# Patient Record
Sex: Female | Born: 2000 | Race: White | Hispanic: Yes | Marital: Single | Smoking: Never smoker
Health system: Southern US, Community
[De-identification: ages and names within clinical notes are randomized; demographics above are authoritative.]

## PROBLEM LIST (undated history)

## (undated) ENCOUNTER — Ambulatory Visit (HOSPITAL_COMMUNITY)

## (undated) DIAGNOSIS — J45909 Unspecified asthma, uncomplicated: Secondary | ICD-10-CM

## (undated) HISTORY — PX: TYMPANOSTOMY TUBE PLACEMENT: SHX32

---
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
## 2020-06-11 ENCOUNTER — Encounter (HOSPITAL_COMMUNITY): Payer: Self-pay

## 2020-06-11 ENCOUNTER — Emergency Department (HOSPITAL_COMMUNITY)
Admission: EM | Admit: 2020-06-11 | Discharge: 2020-06-12 | Disposition: A | Discharge status: Home / Self Care | Payer: Medicaid Other | Attending: Emergency Medicine | Admitting: Emergency Medicine

## 2020-06-11 ENCOUNTER — Other Ambulatory Visit: Payer: Self-pay

## 2020-06-11 DIAGNOSIS — R1030 Lower abdominal pain, unspecified: Secondary | ICD-10-CM | POA: Insufficient documentation

## 2020-06-11 DIAGNOSIS — Z5321 Procedure and treatment not carried out due to patient leaving prior to being seen by health care provider: Secondary | ICD-10-CM | POA: Insufficient documentation

## 2020-06-11 LAB — URINALYSIS, ROUTINE W REFLEX MICROSCOPIC
Bilirubin Urine: NEGATIVE
Glucose, UA: NEGATIVE mg/dL
Ketones, ur: NEGATIVE mg/dL
Nitrite: NEGATIVE
Protein, ur: 100 mg/dL — AB
RBC / HPF: >50 RBC/hpf — ABNORMAL HIGH (ref 0–5)
Specific Gravity, Urine: 1.019 (ref 1.005–1.030)
WBC, UA: >50 WBC/hpf — ABNORMAL HIGH (ref 0–5)
pH: 6 (ref 5.0–8.0)

## 2020-06-11 LAB — PREGNANCY, URINE: Preg Test, Ur: NEGATIVE

## 2020-06-11 NOTE — ED Triage Notes (Signed)
Pt reports lower abd pain since earlier today, no n/v, no vaginal bleeding or discharge

## 2020-06-12 NOTE — ED Notes (Signed)
Patient called for vitals recheck with no response 

## 2021-01-12 ENCOUNTER — Ambulatory Visit (HOSPITAL_COMMUNITY)
Admission: EM | Admit: 2021-01-12 | Discharge: 2021-01-12 | Disposition: A | Discharge status: Home / Self Care | Payer: Medicaid Other | Attending: Physician Assistant | Admitting: Physician Assistant

## 2021-01-12 ENCOUNTER — Encounter (HOSPITAL_COMMUNITY): Payer: Self-pay | Admitting: Emergency Medicine

## 2021-01-12 ENCOUNTER — Other Ambulatory Visit: Payer: Self-pay

## 2021-01-12 DIAGNOSIS — R52 Pain, unspecified: Secondary | ICD-10-CM

## 2021-01-12 DIAGNOSIS — R519 Headache, unspecified: Secondary | ICD-10-CM | POA: Diagnosis not present

## 2021-01-12 LAB — CBC WITH DIFFERENTIAL/PLATELET
Abs Immature Granulocytes: 0.02 10*3/uL (ref 0.00–0.07)
Basophils Absolute: 0 10*3/uL (ref 0.0–0.1)
Basophils Relative: 1 %
Eosinophils Absolute: 0 10*3/uL (ref 0.0–0.5)
Eosinophils Relative: 1 %
HCT: 41.1 % (ref 36.0–46.0)
Hemoglobin: 13 g/dL (ref 12.0–15.0)
Immature Granulocytes: 1 %
Lymphocytes Relative: 43 %
Lymphs Abs: 1.6 10*3/uL (ref 0.7–4.0)
MCH: 25.5 pg — ABNORMAL LOW (ref 26.0–34.0)
MCHC: 31.6 g/dL (ref 30.0–36.0)
MCV: 80.7 fL (ref 80.0–100.0)
Monocytes Absolute: 0.4 10*3/uL (ref 0.1–1.0)
Monocytes Relative: 12 %
Neutro Abs: 1.5 10*3/uL — ABNORMAL LOW (ref 1.7–7.7)
Neutrophils Relative %: 42 %
Platelets: 265 10*3/uL (ref 150–400)
RBC: 5.09 MIL/uL (ref 3.87–5.11)
RDW: 15.8 % — ABNORMAL HIGH (ref 11.5–15.5)
WBC: 3.7 10*3/uL — ABNORMAL LOW (ref 4.0–10.5)
nRBC: 0.5 % — ABNORMAL HIGH (ref 0.0–0.2)

## 2021-01-12 LAB — COMPREHENSIVE METABOLIC PANEL
ALT: 27 U/L (ref 0–44)
AST: 37 U/L (ref 15–41)
Albumin: 4 g/dL (ref 3.5–5.0)
Alkaline Phosphatase: 80 U/L (ref 38–126)
Anion gap: 9 (ref 5–15)
BUN: 6 mg/dL (ref 6–20)
CO2: 23 mmol/L (ref 22–32)
Calcium: 8.9 mg/dL (ref 8.9–10.3)
Chloride: 103 mmol/L (ref 98–111)
Creatinine, Ser: 0.54 mg/dL (ref 0.44–1.00)
GFR, Estimated: >60 mL/min (ref >60)
Glucose, Bld: 77 mg/dL (ref 70–99)
Potassium: 4.1 mmol/L (ref 3.5–5.1)
Sodium: 135 mmol/L (ref 135–145)
Total Bilirubin: 0.1 mg/dL — ABNORMAL LOW (ref 0.3–1.2)
Total Protein: 8.2 g/dL — ABNORMAL HIGH (ref 6.5–8.1)

## 2021-01-12 LAB — SEDIMENTATION RATE: Sed Rate: 17 mm/hr (ref 0–22)

## 2021-01-12 MED ORDER — BACLOFEN 10 MG PO TABS: 10.0000 mg | ORAL_TABLET | Freq: Every evening | ORAL | 0 refills | Status: AC | PRN

## 2021-01-12 MED ORDER — PREDNISONE 10 MG (21) PO TBPK: ORAL_TABLET | ORAL | 0 refills | Status: AC

## 2021-01-12 NOTE — ED Triage Notes (Signed)
headache and nausea started yesterday.  Last night , headache was very painful.  Took tylenol and no relief.  Pain has been constant.  Patient has prescription glasses, but while having this headache, glasses add to discomfort.    Wednesday, felt nausea, no energy.  Thursday had a fever, chills, unable to eat.

## 2021-01-12 NOTE — ED Notes (Signed)
Both eyes distance 20/50

## 2021-01-12 NOTE — Discharge Instructions (Signed)
We will start prednisone to try to alleviate your headache as well manage her cough and other symptoms.  I have called in a muscle relaxer that you can take at night but you should not drive or drink alcohol with this medication as drowsiness is a common side effect.  While you are taking prednisone do not take any additional NSAIDs (aspirin, ibuprofen/Advil, naproxen/Aleve).  You can use Tylenol for breakthrough pain.  If your lab work shows that you have significant inflammation you will need to be scheduled with the vascular surgeon and we will contact you.  If you have any worsening symptoms you need to go to the emergency room as we discussed.

## 2021-01-12 NOTE — ED Provider Notes (Signed)
[name]    CSN: [id number] Arrival date & time: 01/12/21  1353      History   Chief Complaint Chief Complaint  Patient presents with  . Headache    HPI Kimberly Myers is a 20 y.o. female.   Patient presents today with a 3-day history of severe headache.  She denies history of migraines or family history of primary headache disorder.  She does report initially symptoms began as URI with mild fever, cough, congestion the symptoms have since resolved and headache has persisted.  Headache pain has improved over the past 24 hours but not yet resolved.  It is currently rated 4 on a 0-10 pain scale, localized to left temple without radiation, described as aching/throbbing, worse with mastication, no alleviating factors identified.  She denies any visual changes.  She denies associated dysarthria, focal weakness, nausea, vomiting, photophobia, phonophobia.  She has not tried any over-the-counter medications for symptom management.  She denies any medication changes prior to symptom onset.  She denies any recent head injury.  She denies any personal or family history of autoimmune condition including vasculitis/temporal arteritis.     Past Medical History:  Diagnosis Date  . Asthma    inhalers as needed    There are no problems to display for this patient.   Past Surgical History:  Procedure Laterality Date  . TYMPANOPLASTY Right 06/13/2019   Procedure: TYMPANOPLASTY;  Surgeon: Izora Gala, MD;  Location: [facility];  Service: ENT;  Laterality: Right;  . TYMPANOSTOMY TUBE PLACEMENT      OB History   No obstetric history on file.      Home Medications    Prior to Admission medications   Medication Sig Start Date End Date Taking? Authorizing Provider  baclofen (LIORESAL) 10 MG tablet Take 1 tablet (10 mg total) by mouth at bedtime as needed for muscle spasms. 01/12/21  Yes Ife Vitelli, Derry Skill, PA-C  HYDROcodone-acetaminophen (NORCO) 7.5-325 MG  tablet Take 1 tablet by mouth every 6 (six) hours as needed for moderate pain. 06/13/19  Yes Izora Gala, MD  predniSONE (STERAPRED UNI-PAK 21 TAB) 10 MG (21) TBPK tablet As directed 01/12/21  Yes Elisia Stepp K, PA-C  promethazine (PHENERGAN) 25 MG suppository Place 1 suppository (25 mg total) rectally every 6 (six) hours as needed for nausea or vomiting. Patient not taking: Reported on 01/12/2021 06/13/19   Izora Gala, MD    Family History Family History  Problem Relation Age of Onset  . Healthy Mother     Social History Social History   Tobacco Use  . Smoking status: Never Smoker  . Smokeless tobacco: Never Used  Vaping Use  . Vaping Use: Never used  Substance Use Topics  . Alcohol use: No  . Drug use: Never     Allergies   Patient has no known allergies.   Review of Systems Review of Systems  Constitutional: Negative for activity change, appetite change, fatigue and fever.  HENT: Negative for trouble swallowing.   Eyes: Negative for visual disturbance.  Respiratory: Negative for cough and shortness of breath.   Cardiovascular: Negative for chest pain.  Gastrointestinal: Negative for abdominal pain, diarrhea, nausea and vomiting.  Neurological: Positive for headaches. Negative for dizziness, seizures, syncope, weakness, light-headedness and numbness.     Physical Exam Triage Vital Signs ED Triage Vitals  Enc Vitals Group     BP 01/12/21 1559 123/66     Pulse Rate 01/12/21 1559 80     Resp  01/12/21 1559 18     Temp 01/12/21 1559 99.8 F (37.7 C)     Temp Source 01/12/21 1559 Oral     SpO2 01/12/21 1559 98 %     Weight --      Height --      Head Circumference --      Peak Flow --      Pain Score 01/12/21 1555 4     Pain Loc --      Pain Edu? --      Excl. in [place]? --    No data found.  Updated Vital Signs BP 123/66 (BP Location: Right Arm)   Pulse 80   Temp 99.8 F (37.7 C) (Oral)   Resp 18   LMP 11/23/2020   SpO2 98%   Visual Acuity Right  Eye Distance: 20/70 Left Eye Distance: 20/50 Bilateral Distance:    Right Eye Near:   Left Eye Near:    Bilateral Near:     Physical Exam Vitals reviewed.  Constitutional:      General: She is awake. She is not in acute distress.    Appearance: Normal appearance. She is not ill-appearing.     Comments: Very pleasant female appears stated age in no acute distress  HENT:     Head: Normocephalic and atraumatic. No raccoon eyes, Battle's sign or contusion.     Jaw: No pain on movement.     Right Ear: Ear canal and external ear normal. No hemotympanum. Tympanic membrane is scarred.     Left Ear: Tympanic membrane, ear canal and external ear normal. No hemotympanum. Tympanic membrane is not scarred.     Nose: Nose normal.     Mouth/Throat:     Tongue: Tongue does not deviate from midline.     Pharynx: Uvula midline. No oropharyngeal exudate or posterior oropharyngeal erythema.  Eyes:     Extraocular Movements: Extraocular movements intact.     Pupils: Pupils are equal, round, and reactive to light.  Cardiovascular:     Rate and Rhythm: Normal rate and regular rhythm.     Heart sounds: No murmur heard.   Pulmonary:     Effort: Pulmonary effort is normal.     Breath sounds: Normal breath sounds. No wheezing, rhonchi or rales.     Comments: Clear to auscultation bilaterally Abdominal:     Palpations: Abdomen is soft.     Tenderness: There is no abdominal tenderness.  Musculoskeletal:     Cervical back: No spinous process tenderness or muscular tenderness.     Comments: Strength 5/5 bilateral upper and lower extremities  Lymphadenopathy:     Head:     Right side of head: No submental, submandibular or tonsillar adenopathy.     Left side of head: No submental, submandibular or tonsillar adenopathy.  Neurological:     General: No focal deficit present.     Mental Status: She is oriented to person, place, and time.     Cranial Nerves: Cranial nerves are intact.     Motor: Motor  function is intact.     Coordination: Coordination is intact.     Gait: Gait is intact.  Psychiatric:        Behavior: Behavior is cooperative.      UC Treatments / Results  Labs (all labs ordered are listed, but only abnormal results are displayed) Labs Reviewed  SEDIMENTATION RATE  CBC WITH DIFFERENTIAL/PLATELET  COMPREHENSIVE METABOLIC PANEL    EKG   Radiology No results found.  Procedures Procedures (including critical care time)  Medications Ordered in UC Medications - No data to display  Initial Impression / Assessment and Plan / UC Course  I have reviewed the triage vital signs and the nursing notes.  Pertinent labs & imaging results that were available during my care of the patient were reviewed by me and considered in my medical decision making (see chart for details).     Patient denies any visual changes.  Low suspicion for temporal arteritis but given temporal pain with pain exacerbated by mastication will obtain CBC, CMP, ESR.  Discussed that if ESR is elevated patient will need to see vascular surgeon for temporal artery biopsy.  She was prescribed prednisone taper as this would help improve symptoms of both URI and headache.  She was instructed to take NSAIDs with this medication due to risk of GI bleeding.  She was prescribed baclofen to be used at night with instruction to drive or drink alcohol with this medication.  She does not have a PCP we will try to establish her with a PCP in the area through PCP assistance.  Discussed that if symptoms persist will need to consider seeing neurology and/or imaging but this would need to be arranged through PCP.  Discussed at length alarm symptoms that warrant emergent evaluation.  Strict return precautions given to which patient expressed understanding.  Final Clinical Impressions(s) / UC Diagnoses   Final diagnoses:  Nonintractable headache, unspecified chronicity pattern, unspecified headache type  Pain aggravated  by mastication     Discharge Instructions     We will start prednisone to try to alleviate your headache as well manage her cough and other symptoms.  I have called in a muscle relaxer that you can take at night but you should not drive or drink alcohol with this medication as drowsiness is a common side effect.  While you are taking prednisone do not take any additional NSAIDs (aspirin, ibuprofen/Advil, naproxen/Aleve).  You can use Tylenol for breakthrough pain.  If your lab work shows that you have significant inflammation you will need to be scheduled with the vascular surgeon and we will contact you.  If you have any worsening symptoms you need to go to the emergency room as we discussed.    ED Prescriptions    Medication Sig Dispense Auth. Provider   predniSONE (STERAPRED UNI-PAK 21 TAB) 10 MG (21) TBPK tablet As directed 21 tablet Aloise Copus K, PA-C   baclofen (LIORESAL) 10 MG tablet Take 1 tablet (10 mg total) by mouth at bedtime as needed for muscle spasms. 10 each Darnette Lampron, Derry Skill, PA-C     PDMP not reviewed this encounter.   Terrilee Croak, PA-C 01/12/21 1754

## 2021-07-30 IMAGING — DX DG CHEST 2V
2 series · 2 of 2 positions shown · non-contrast
Comparison: Radiograph February 05, 2008

CLINICAL DATA: Chest pain

EXAM:
CHEST - 2 VIEW

[chest pa]
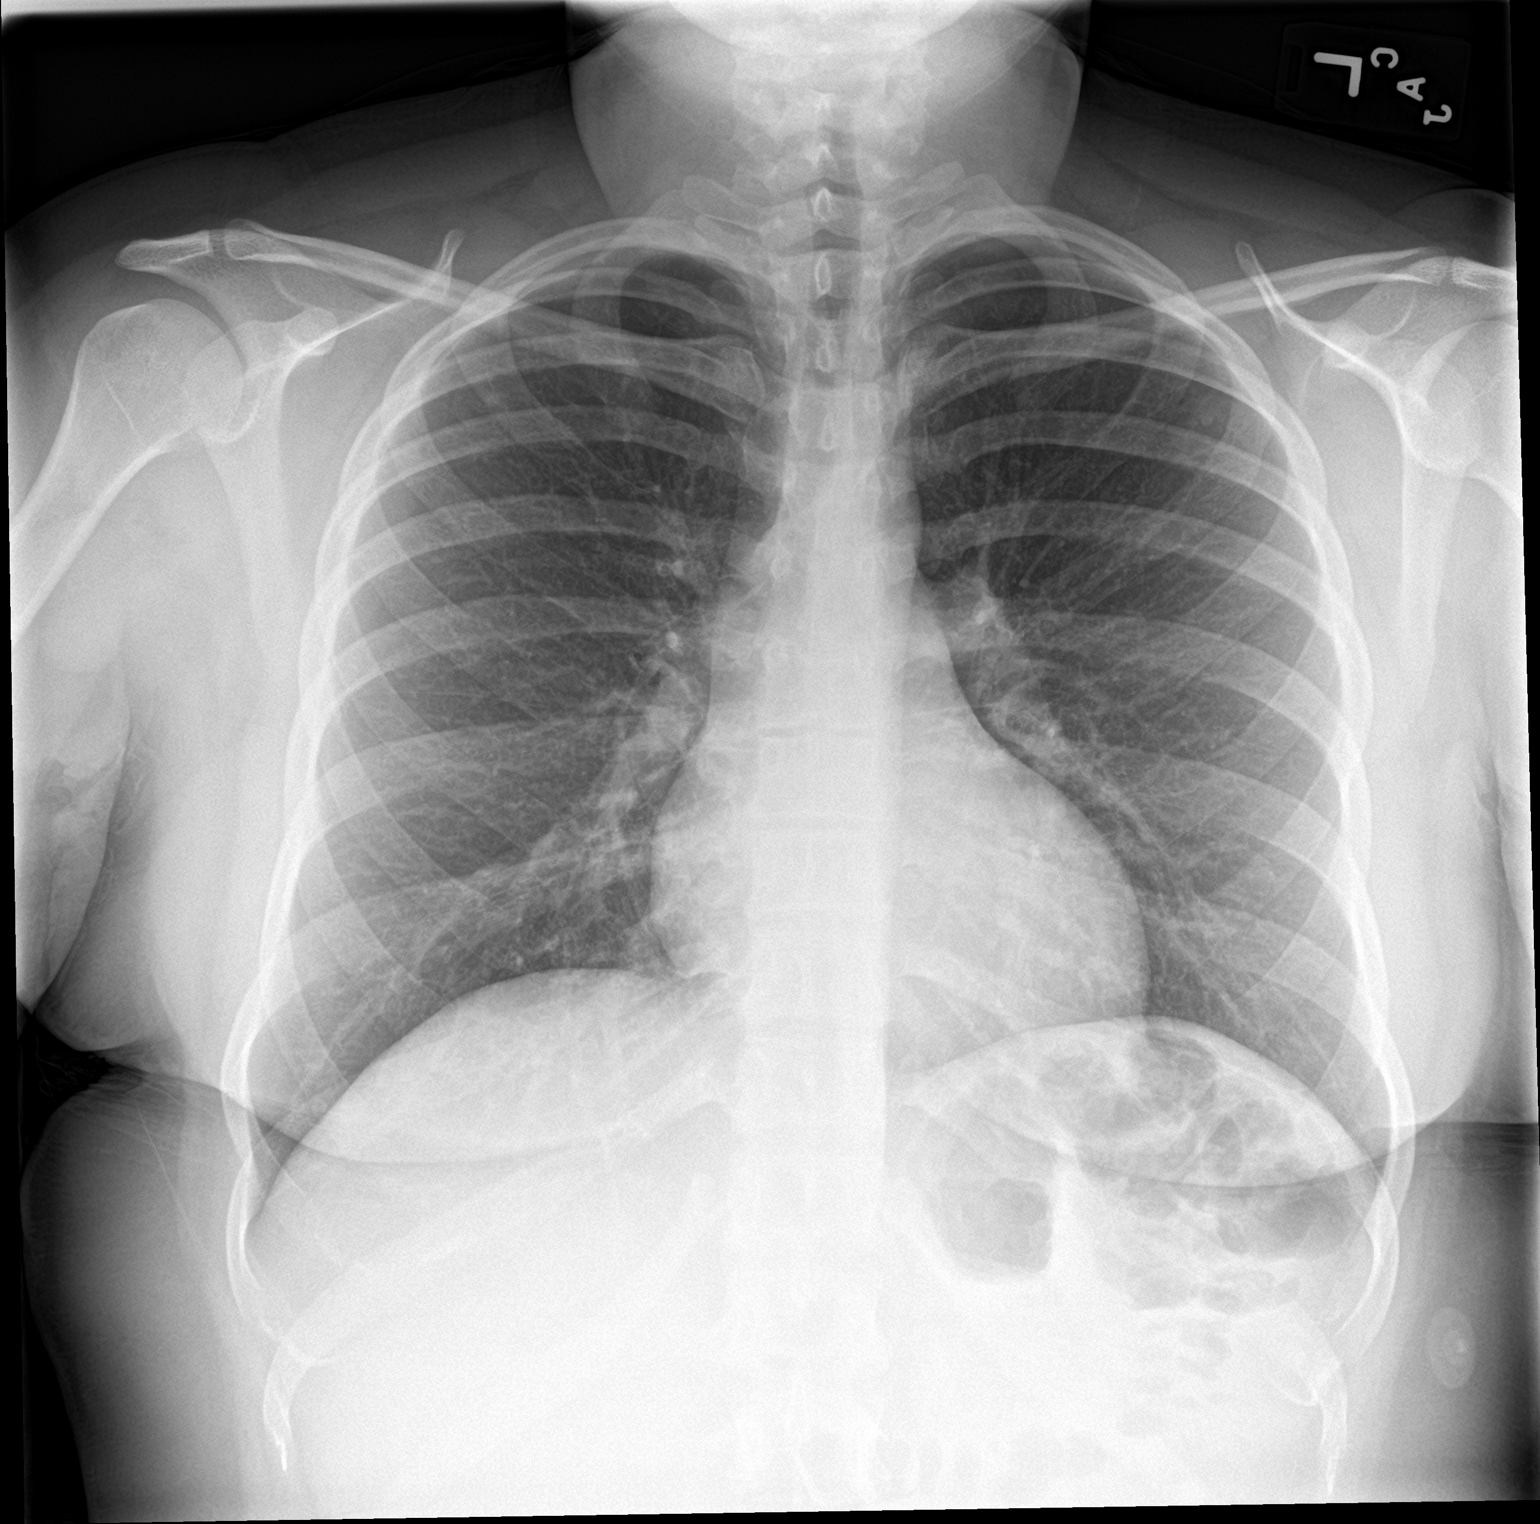

[chest lat]
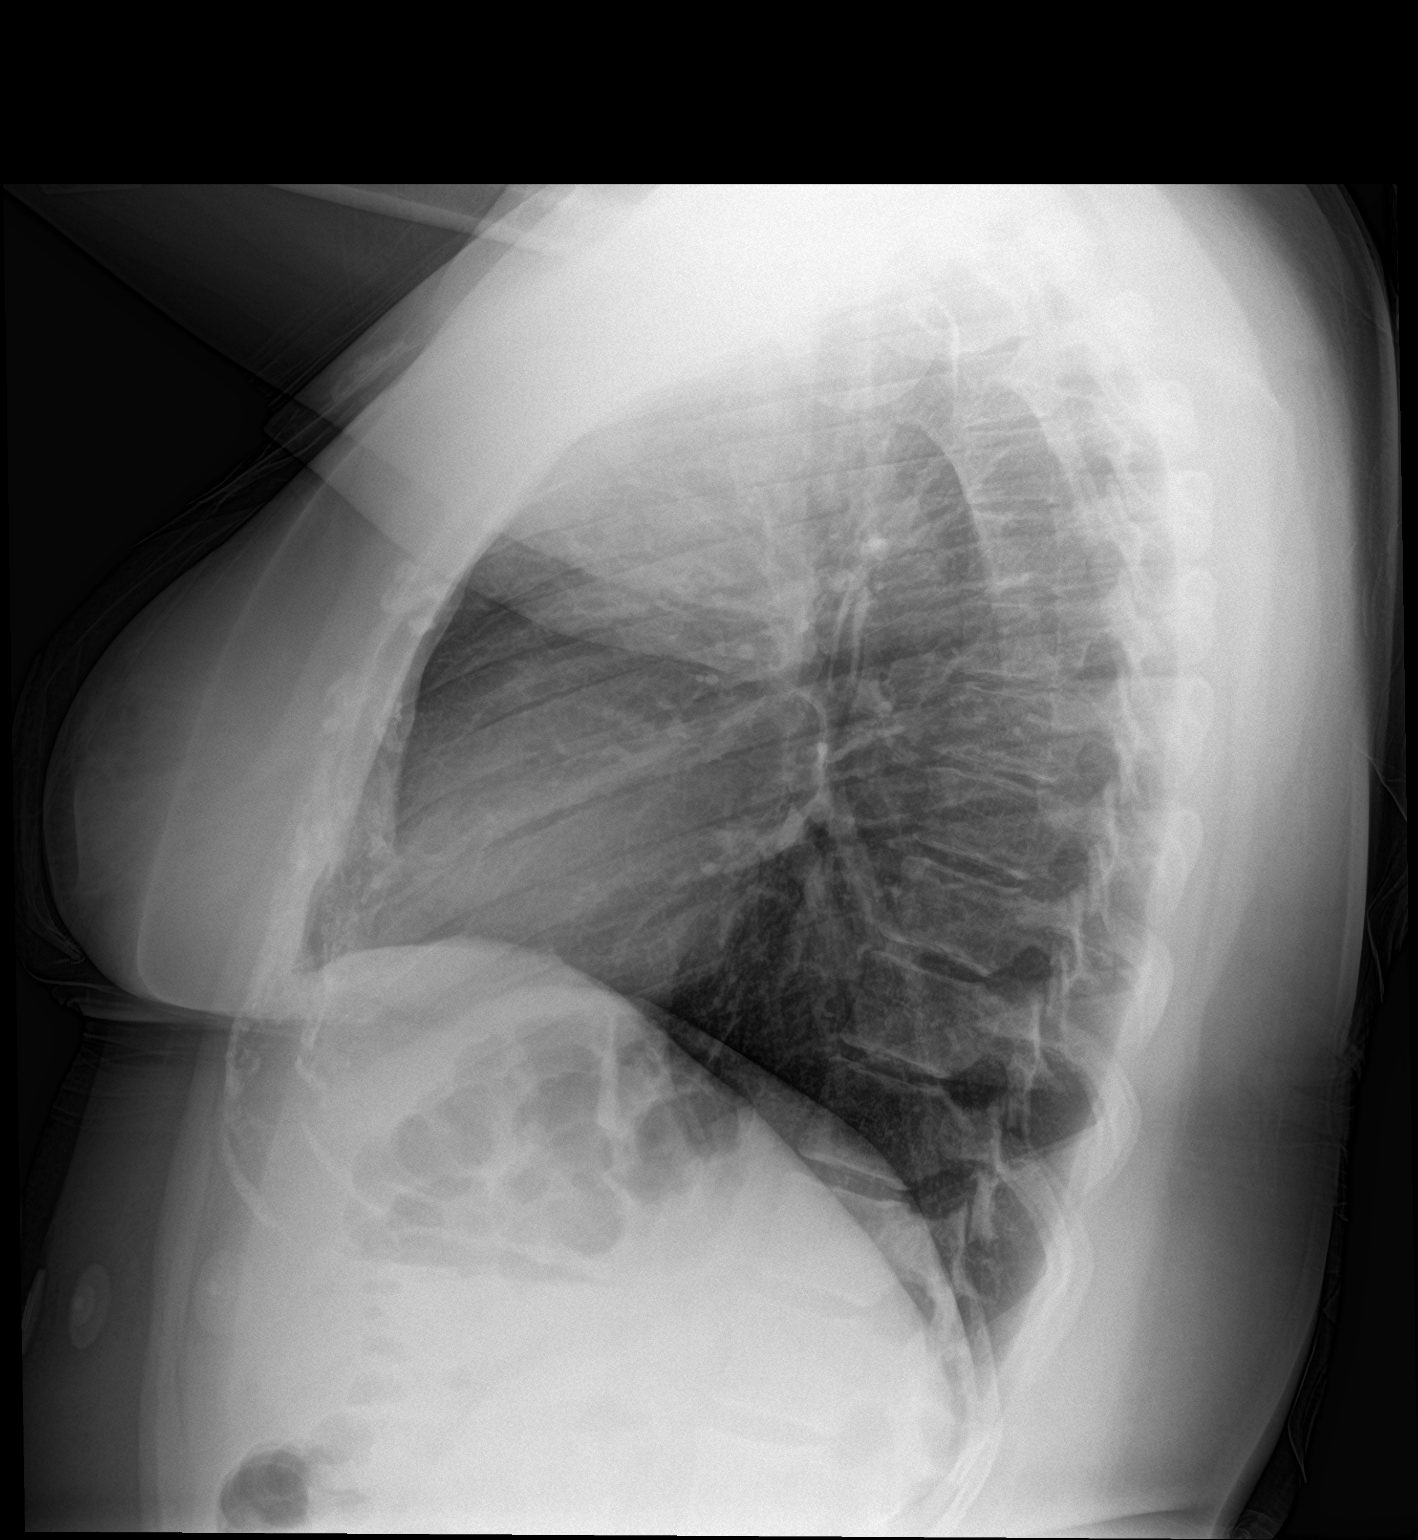

[2 of 2 positions shown; findings below may reference images not displayed]

FINDINGS: The heart size and mediastinal contours are within normal limits.
Both lungs are clear. The visualized skeletal structures are
unremarkable.
IMPRESSION: No active cardiopulmonary disease.

## 2024-08-26 ENCOUNTER — Other Ambulatory Visit: Payer: Self-pay

## 2024-08-26 ENCOUNTER — Ambulatory Visit
Admission: RE | Admit: 2024-08-26 | Discharge: 2024-08-26 | Disposition: A | Discharge status: Home / Self Care | Source: Ambulatory Visit | Attending: Family Medicine | Admitting: Family Medicine

## 2024-08-26 VITALS — BP 122/86 | HR 82 | Temp 98.2°F | Resp 20 | Ht 61.0 in | Wt 190.0 lb

## 2024-08-26 DIAGNOSIS — J069 Acute upper respiratory infection, unspecified: Secondary | ICD-10-CM | POA: Diagnosis not present

## 2024-08-26 LAB — POC COVID19/FLU A&B COMBO
Covid Antigen, POC: NEGATIVE
Influenza A Antigen, POC: NEGATIVE
Influenza B Antigen, POC: NEGATIVE

## 2024-08-26 NOTE — Discharge Instructions (Signed)
 Take plain guaifenesin (1200mg  extended release tabs such as Mucinex) twice daily, with plenty of water, for cough and congestion.  May add Pseudoephedrine (30mg , one or two every 4 to 6 hours) for sinus congestion.  Get adequate rest.   May use Afrin nasal spray (or generic oxymetazoline) each morning for about 5 days and then discontinue.  Also recommend using saline nasal spray several times daily and saline nasal irrigation (AYR is a common brand).  Use Flonase nasal spray each morning after using Afrin nasal spray and saline nasal irrigation. Try warm salt water gargles for sore throat.  Stop all antihistamines (Nyquil, etc) for now, and other non-prescription cough/cold preparations. May take Tylenol  or ibuprofen as needed. May take Delsym Cough Suppressant (12 Hour Cough Relief) at bedtime for nighttime cough.   If symptoms become significantly worse during the night or over the weekend, proceed to the local emergency room.

## 2024-08-26 NOTE — ED Triage Notes (Signed)
 Pt presenting with c/o headache,non productive cough, body ache,runny nose and sore throat x 2 days. Pt stated that she took NyQuil last night with minimal effectiveness.

## 2024-08-26 NOTE — ED Provider Notes (Signed)
 " [facility]    CSN: [id number] Arrival date & time: 08/26/24  1452      History   Chief Complaint Chief Complaint  Patient presents with   Fever    Entered by patient    HPI Kimberly Myers is a 24 y.o. female.   Yesterday patient developed sore throat, myalgias, and a mild cough.  Last night she had chills but no fever. Today she had nasal congestion/drainage, headache, and light-headedness.  She denies pleuritic pain or shortness of breath.  The history is provided by the patient and a caregiver.    Past Medical History:  Diagnosis Date   Asthma    inhalers as needed    There are no active problems to display for this patient.   Past Surgical History:  Procedure Laterality Date   TYMPANOPLASTY Right 06/13/2019   Procedure: TYMPANOPLASTY;  Surgeon: Jesus Oliphant, MD;  Location: [facility];  Service: ENT;  Laterality: Right;   TYMPANOSTOMY TUBE PLACEMENT      OB History   No obstetric history on file.      Home Medications    Prior to Admission medications  Medication Sig Start Date End Date Taking? Authorizing Provider  baclofen  (LIORESAL ) 10 MG tablet Take 1 tablet (10 mg total) by mouth at bedtime as needed for muscle spasms. 01/12/21   Raspet, Erin K, PA-C  HYDROcodone -acetaminophen  (NORCO) 7.5-325 MG tablet Take 1 tablet by mouth every 6 (six) hours as needed for moderate pain. 06/13/19   Jesus Oliphant, MD  predniSONE  (STERAPRED UNI-PAK 21 TAB) 10 MG (21) TBPK tablet As directed 01/12/21   Raspet, Erin K, PA-C  promethazine  (PHENERGAN ) 25 MG suppository Place 1 suppository (25 mg total) rectally every 6 (six) hours as needed for nausea or vomiting. Patient not taking: Reported on 01/12/2021 06/13/19   Jesus Oliphant, MD    Family History Family History  Problem Relation Age of Onset   Healthy Mother     Social History Social History[1]   Allergies   Patient has no known allergies.   Review of Systems Review of  Systems + sore throat + cough No pleuritic pain No wheezing + nasal congestion + post-nasal drainage No sinus pain/pressure No itchy/red eyes No earache No hemoptysis No SOB No fever, + chills No nausea No vomiting No abdominal pain No diarrhea No urinary symptoms No skin rash + fatigue + myalgias + headache Used OTC meds (Nyquil) without relief    Physical Exam Triage Vital Signs ED Triage Vitals  Encounter Vitals Group     BP 08/26/24 1549 122/86     Girls Systolic BP Percentile --      Girls Diastolic BP Percentile --      Boys Systolic BP Percentile --      Boys Diastolic BP Percentile --      Pulse Rate 08/26/24 1549 82     Resp 08/26/24 1549 20     Temp 08/26/24 1549 98.2 F (36.8 C)     Temp Source 08/26/24 1549 Oral     SpO2 08/26/24 1549 97 %     Weight 08/26/24 1551 190 lb (86.2 kg)     Height 08/26/24 1551 5' 1 (1.549 m)     Head Circumference --      Peak Flow --      Pain Score 08/26/24 1551 0     Pain Loc --      Pain Education --      Exclude  from Growth Chart --    No data found.  Updated Vital Signs BP 122/86 (BP Location: Right Arm)   Pulse 82   Temp 98.2 F (36.8 C) (Oral)   Resp 20   Ht 5' 1 (1.549 m)   Wt 86.2 kg   LMP 08/23/2024 (Exact Date)   SpO2 97%   BMI 35.90 kg/m   Visual Acuity Right Eye Distance:   Left Eye Distance:   Bilateral Distance:    Right Eye Near:   Left Eye Near:    Bilateral Near:     Physical Exam Nursing notes and Vital Signs reviewed. Appearance:  Patient appears stated age, and in no acute distress Eyes:  Pupils are equal, round, and reactive to light and accomodation.  Extraocular movement is intact.  Conjunctivae are not inflamed  Ears:  Canals normal.  Tympanic membranes normal.  Nose:  Mildly congested turbinates.  No sinus tenderness.  Pharynx:  Normal Neck:  Supple. No adenopathy. Lungs:  Clear to auscultation.  Breath sounds are equal.  Moving air well. Heart:  Regular rate and  rhythm without murmurs, rubs, or gallops.  Abdomen:  Nontender without masses or hepatosplenomegaly.  Bowel sounds are present.  No CVA or flank tenderness.  Extremities:  No edema.  Skin:  No rash present.   UC Treatments / Results  Labs (all labs ordered are listed, but only abnormal results are displayed) Labs Reviewed  POC COVID19/FLU A&B COMBO - Normal    EKG   Radiology No results found.  Procedures Procedures (including critical care time)  Medications Ordered in UC Medications - No data to display  Initial Impression / Assessment and Plan / UC Course  I have reviewed the triage vital signs and the nursing notes.  Pertinent labs & imaging results that were available during my care of the patient were reviewed by me and considered in my medical decision making (see chart for details).    Benign exam.  There is no evidence of bacterial infection today.  Treat symptomatically for now  Followup with Family Doctor if not improved in one week.   Final Clinical Impressions(s) / UC Diagnoses   Final diagnoses:  Viral URI with cough     Discharge Instructions      Take plain guaifenesin (1200mg  extended release tabs such as Mucinex) twice daily, with plenty of water, for cough and congestion.  May add Pseudoephedrine (30mg , one or two every 4 to 6 hours) for sinus congestion.  Get adequate rest.   May use Afrin nasal spray (or generic oxymetazoline) each morning for about 5 days and then discontinue.  Also recommend using saline nasal spray several times daily and saline nasal irrigation (AYR is a common brand).  Use Flonase nasal spray each morning after using Afrin nasal spray and saline nasal irrigation. Try warm salt water gargles for sore throat.  Stop all antihistamines (Nyquil, etc) for now, and other non-prescription cough/cold preparations. May take Tylenol  or ibuprofen as needed. May take Delsym Cough Suppressant (12 Hour Cough Relief) at bedtime for  nighttime cough.   If symptoms become significantly worse during the night or over the weekend, proceed to the local emergency room.       ED Prescriptions   None         [1]  Social History Tobacco Use   Smoking status: Never   Smokeless tobacco: Never  Vaping Use   Vaping status: Never Used  Substance Use Topics   Alcohol use: No  Drug use: Never     Pauline Garnette LABOR, MD 08/28/24 1226  "

## 2024-08-27 ENCOUNTER — Ambulatory Visit (HOSPITAL_COMMUNITY)
